# Patient Record
Sex: Male | Born: 1965 | Race: White | Hispanic: No | Smoking: Current every day smoker
Health system: Southern US, Community
[De-identification: ages and names within clinical notes are randomized; demographics above are authoritative.]

---

## 2008-07-30 ENCOUNTER — Encounter: Admission: RE | Admit: 2008-07-30 | Discharge: 2008-07-30 | Payer: Self-pay | Admitting: Family Medicine

## 2009-09-18 ENCOUNTER — Encounter: Admission: RE | Admit: 2009-09-18 | Discharge: 2009-09-18 | Payer: Self-pay | Admitting: Emergency Medicine

## 2011-01-18 ENCOUNTER — Other Ambulatory Visit: Payer: Self-pay | Admitting: Emergency Medicine

## 2011-01-18 ENCOUNTER — Ambulatory Visit
Admission: RE | Admit: 2011-01-18 | Discharge: 2011-01-18 | Disposition: A | Discharge status: Home / Self Care | Payer: Self-pay | Source: Ambulatory Visit | Attending: Emergency Medicine | Admitting: Emergency Medicine

## 2011-01-18 DIAGNOSIS — Z0000 Encounter for general adult medical examination without abnormal findings: Secondary | ICD-10-CM

## 2013-01-16 ENCOUNTER — Ambulatory Visit (INDEPENDENT_AMBULATORY_CARE_PROVIDER_SITE_OTHER): Payer: Self-pay

## 2013-01-16 ENCOUNTER — Other Ambulatory Visit: Payer: Self-pay | Admitting: Family Medicine

## 2013-01-16 DIAGNOSIS — F17200 Nicotine dependence, unspecified, uncomplicated: Secondary | ICD-10-CM

## 2013-01-16 DIAGNOSIS — Z0289 Encounter for other administrative examinations: Secondary | ICD-10-CM

## 2015-06-12 ENCOUNTER — Emergency Department (INDEPENDENT_AMBULATORY_CARE_PROVIDER_SITE_OTHER)
Admission: EM | Admit: 2015-06-12 | Discharge: 2015-06-12 | Disposition: A | Discharge status: Home / Self Care | Payer: Self-pay | Source: Home / Self Care | Attending: Family Medicine | Admitting: Family Medicine

## 2015-06-12 ENCOUNTER — Encounter: Payer: Self-pay | Admitting: *Deleted

## 2015-06-12 ENCOUNTER — Emergency Department (INDEPENDENT_AMBULATORY_CARE_PROVIDER_SITE_OTHER): Payer: Worker's Compensation

## 2015-06-12 DIAGNOSIS — M25531 Pain in right wrist: Secondary | ICD-10-CM | POA: Diagnosis not present

## 2015-06-12 DIAGNOSIS — S66911A Strain of unspecified muscle, fascia and tendon at wrist and hand level, right hand, initial encounter: Secondary | ICD-10-CM

## 2015-06-12 MED ORDER — NAPROXEN 500 MG PO TABS
500.0000 mg | ORAL_TABLET | Freq: Two times a day (BID) | ORAL | Status: DC
Start: 2015-06-12 — End: 2021-08-26
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
## 2015-06-12 NOTE — Discharge Instructions (Signed)
Thank you for coming in today. Take it easy.  Return in 1-2 weeks with me in Sports Medicine for the "all clear" Your workers comp insurance may require you to see someone else.   Wrist Sprain With Rehab A sprain is an injury in which a ligament that maintains the proper alignment of a joint is partially or completely torn. The ligaments of the wrist are susceptible to sprains. Sprains are classified into three categories. Grade 1 sprains cause pain, but the tendon is not lengthened. Grade 2 sprains include a lengthened ligament because the ligament is stretched or partially ruptured. With grade 2 sprains there is still function, although the function may be diminished. Grade 3 sprains are characterized by a complete tear of the tendon or muscle, and function is usually impaired. SYMPTOMS   Pain tenderness, inflammation, and/or bruising (contusion) of the injury.  A "pop" or tear felt and/or heard at the time of injury.  Decreased wrist function. CAUSES  A wrist sprain occurs when a force is placed on one or more ligaments that is greater than it/they can withstand. Common mechanisms of injury include:  Catching a ball with your hands.  Repetitive and/ or strenuous extension or flexion of the wrist. RISK INCREASES WITH:  Previous wrist injury.  Contact sports (boxing or wrestling).  Activities in which falling is common.  Poor strength and flexibility.  Improperly fitted or padded protective equipment. PREVENTION  Warm up and stretch properly before activity.  Allow for adequate recovery between workouts.  Maintain physical fitness:  Strength, flexibility, and endurance.  Cardiovascular fitness.  Protect the wrist joint by limiting its motion with the use of taping, braces, or splints.  Protect the wrist after injury for 6 to 12 months. PROGNOSIS  The prognosis for wrist sprains depends on the degree of injury. Grade 1 sprains require 2 to 6 weeks of treatment. Grade 2  sprains require 6 to 8 weeks of treatment, and grade 3 sprains require up to 12 weeks.  RELATED COMPLICATIONS   Prolonged healing time, if improperly treated or re-injured.  Recurrent symptoms that result in a chronic problem.  Injury to nearby structures (bone, cartilage, nerves, or tendons).  Arthritis of the wrist.  Inability to compete in athletics at a high level.  Wrist stiffness or weakness.  Progression to a complete rupture of the ligament. TREATMENT  Treatment initially involves resting from any activities that aggravate the symptoms, and the use of ice and medications to help reduce pain and inflammation. Your caregiver may recommend immobilizing the wrist for a period of time in order to reduce stress on the ligament and allow for healing. After immobilization it is important to perform strengthening and stretching exercises to help regain strength and a full range of motion. These exercises may be completed at home or with a therapist. Surgery is not usually required for wrist sprains, unless the ligament has been ruptured (grade 3 sprain). MEDICATION   If pain medication is necessary, then nonsteroidal anti-inflammatory medications, such as aspirin and ibuprofen, or other minor pain relievers, such as acetaminophen, are often recommended.  Do not take pain medication for 7 days before surgery.  Prescription pain relievers may be given if deemed necessary by your caregiver. Use only as directed and only as much as you need. HEAT AND COLD  Cold treatment (icing) relieves pain and reduces inflammation. Cold treatment should be applied for 10 to 15 minutes every 2 to 3 hours for inflammation and pain and immediately after any  activity that aggravates your symptoms. Use ice packs or massage the area with a piece of ice (ice massage).  Heat treatment may be used prior to performing the stretching and strengthening activities prescribed by your caregiver, physical therapist, or  athletic trainer. Use a heat pack or soak your injury in warm water. SEEK MEDICAL CARE IF:  Treatment seems to offer no benefit, or the condition worsens.  Any medications produce adverse side effects. EXERCISES RANGE OF MOTION (ROM) AND STRETCHING EXERCISES - Wrist Sprain  These exercises may help you when beginning to rehabilitate your injury. Your symptoms may resolve with or without further involvement from your physician, physical therapist or athletic trainer. While completing these exercises, remember:   Restoring tissue flexibility helps normal motion to return to the joints. This allows healthier, less painful movement and activity.  An effective stretch should be held for at least 30 seconds.  A stretch should never be painful. You should only feel a gentle lengthening or release in the stretched tissue. RANGE OF MOTION - Wrist Flexion, Active-Assisted  Extend your right / left elbow with your fingers pointing down.*  Gently pull the back of your hand towards you until you feel a gentle stretch on the top of your forearm.  Hold this position for __________ seconds. Repeat __________ times. Complete this exercise __________ times per day.  *If directed by your physician, physical therapist or athletic trainer, complete this stretch with your elbow bent rather than extended. RANGE OF MOTION - Wrist Extension, Active-Assisted  Extend your right / left elbow and turn your palm upwards.*  Gently pull your palm/fingertips back so your wrist extends and your fingers point more toward the ground.  You should feel a gentle stretch on the inside of your forearm.  Hold this position for __________ seconds. Repeat __________ times. Complete this exercise __________ times per day. *If directed by your physician, physical therapist or athletic trainer, complete this stretch with your elbow bent, rather than extended. RANGE OF MOTION - Supination, Active  Stand or sit with your elbows  at your side. Bend your right / left elbow to 90 degrees.  Turn your palm upward until you feel a gentle stretch on the inside of your forearm.  Hold this position for __________ seconds. Slowly release and return to the starting position. Repeat __________ times. Complete this stretch __________ times per day.  RANGE OF MOTION - Pronation, Active  Stand or sit with your elbows at your side. Bend your right / left elbow to 90 degrees.  Turn your palm downward until you feel a gentle stretch on the top of your forearm.  Hold this position for __________ seconds. Slowly release and return to the starting position. Repeat __________ times. Complete this stretch __________ times per day.  STRETCH - Wrist Flexion  Place the back of your right / left hand on a tabletop leaving your elbow slightly bent. Your fingers should point away from your body.  Gently press the back of your hand down onto the table by straightening your elbow. You should feel a stretch on the top of your forearm.  Hold this position for __________ seconds. Repeat __________ times. Complete this stretch __________ times per day.  STRETCH - Wrist Extension  Place your right / left fingertips on a tabletop leaving your elbow slightly bent. Your fingers should point backwards.  Gently press your fingers and palm down onto the table by straightening your elbow. You should feel a stretch on the inside of  your forearm.  Hold this position for __________ seconds. Repeat __________ times. Complete this stretch __________ times per day.  STRENGTHENING EXERCISES - Wrist Sprain These exercises may help you when beginning to rehabilitate your injury. They may resolve your symptoms with or without further involvement from your physician, physical therapist or athletic trainer. While completing these exercises, remember:   Muscles can gain both the endurance and the strength needed for everyday activities through controlled  exercises.  Complete these exercises as instructed by your physician, physical therapist or athletic trainer. Progress with the resistance and repetition exercises only as your caregiver advises. STRENGTH - Wrist Flexors  Sit with your right / left forearm palm-up and fully supported. Your elbow should be resting below the height of your shoulder. Allow your wrist to extend over the edge of the surface.  Loosely holding a __________ weight or a piece of rubber exercise band/tubing, slowly curl your hand up toward your forearm.  Hold this position for __________ seconds. Slowly lower the wrist back to the starting position in a controlled manner. Repeat __________ times. Complete this exercise __________ times per day.  STRENGTH - Wrist Extensors  Sit with your right / left forearm palm-down and fully supported. Your elbow should be resting below the height of your shoulder. Allow your wrist to extend over the edge of the surface.  Loosely holding a __________ weight or a piece of rubber exercise band/tubing, slowly curl your hand up toward your forearm.  Hold this position for __________ seconds. Slowly lower the wrist back to the starting position in a controlled manner. Repeat __________ times. Complete this exercise __________ times per day.  STRENGTH - Ulnar Deviators  Stand with a ____________________ weight in your right / left hand, or sit holding on to the rubber exercise band/tubing with your opposite arm supported.  Move your wrist so that your pinkie travels toward your forearm and your thumb moves away from your forearm.  Hold this position for __________ seconds and then slowly lower the wrist back to the starting position. Repeat __________ times. Complete this exercise __________ times per day STRENGTH - Radial Deviators  Stand with a ____________________ weight in your  right / left hand, or sit holding on to the rubber exercise band/tubing with your arm  supported.  Raise your hand upward in front of you or pull up on the rubber tubing.  Hold this position for __________ seconds and then slowly lower the wrist back to the starting position. Repeat __________ times. Complete this exercise __________ times per day. STRENGTH - Forearm Supinators  Sit with your right / left forearm supported on a table, keeping your elbow below shoulder height. Rest your hand over the edge, palm down.  Gently grip a hammer or a soup ladle.  Without moving your elbow, slowly turn your palm and hand upward to a "thumbs-up" position.  Hold this position for __________ seconds. Slowly return to the starting position. Repeat __________ times. Complete this exercise __________ times per day.  STRENGTH - Forearm Pronators  Sit with your right / left forearm supported on a table, keeping your elbow below shoulder height. Rest your hand over the edge, palm up.  Gently grip a hammer or a soup ladle.  Without moving your elbow, slowly turn your palm and hand upward to a "thumbs-up" position.  Hold this position for __________ seconds. Slowly return to the starting position. Repeat __________ times. Complete this exercise __________ times per day.  STRENGTH - Grip  Grasp  a tennis ball, a dense sponge, or a large, rolled sock in your hand.  Squeeze as hard as you can without increasing any pain.  Hold this position for __________ seconds. Release your grip slowly. Repeat __________ times. Complete this exercise __________ times per day.    This information is not intended to replace advice given to you by your health care provider. Make sure you discuss any questions you have with your health care provider.   Document Released: 12/27/2004 Document Revised: 09/17/2014 Document Reviewed: 04/10/2008 Elsevier Interactive Patient Education [employer]
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
## 2021-08-26 ENCOUNTER — Other Ambulatory Visit: Payer: Self-pay | Admitting: Family

## 2021-08-26 ENCOUNTER — Encounter: Payer: Self-pay | Admitting: Family

## 2021-08-26 ENCOUNTER — Ambulatory Visit (INDEPENDENT_AMBULATORY_CARE_PROVIDER_SITE_OTHER): Payer: Commercial Managed Care - PPO | Admitting: Family

## 2021-08-26 VITALS — BP 140/82 | HR 87 | Temp 97.6°F | Ht 69.0 in | Wt 213.0 lb

## 2021-08-26 DIAGNOSIS — R002 Palpitations: Secondary | ICD-10-CM | POA: Diagnosis not present

## 2021-08-26 DIAGNOSIS — R030 Elevated blood-pressure reading, without diagnosis of hypertension: Secondary | ICD-10-CM | POA: Diagnosis not present

## 2021-08-26 NOTE — Progress Notes (Signed)
  Jose Wiggins is a 56 y.o. male with the following history as recorded in [employer]:  There are no problems to display for this patient.   No current outpatient medications on file.   No current facility-administered medications for this visit.    Allergies: Patient has no known allergies.  No past medical history on file.  No past surgical history on file.  No family history on file.  Social History   Tobacco Use   Smoking status: Every Day    Packs/day: 0.25    Types: Cigarettes   Smokeless tobacco: Not on file  Substance Use Topics   Alcohol use: No    Subjective:   Patient presents today as a new patient; had DOT CPE recently and his blood pressure was noted to be elevated- 160/91; 163/100; 148/100; per patient, no prior history of hypertension; + smoker; admits that salt intake was high- working on making lifestyle changes; has switched to Mediterranean Diet and lost 8 pounds int he past month; He does mention that occasionally he feels his heart skip a beat but denies any chest pain, shortness of breath, blurred vision or headache.    Per patient, he has a yearly CPE through his employer; he is not interested in establishing care here and was sent here as a follow up to his DOT CPE. He does not want to talk about updating any labs or discuss preventive care needs today.     Objective:  Vitals:   08/26/21 0950 08/26/21 1018 08/26/21 1032  BP: (!) 150/80 (!) 138/90 (!) 140/82  Pulse: 87    Temp: 97.6 F (36.4 C)    TempSrc: Oral    SpO2: 96%    Weight: 213 lb (96.6 kg)    Height: 5\' 9"  (1.753 m)      General: Well developed, well nourished, in no acute distress  Skin : Warm and dry.  Head: Normocephalic and atraumatic  Eyes: Sclera and conjunctiva clear; pupils round and reactive to light; extraocular movements intact  Ears: External normal; canals clear; tympanic membranes normal  Oropharynx: Pink, supple. No suspicious lesions  Neck: Supple without  thyromegaly, adenopathy  Lungs: Respirations unlabored; clear to auscultation bilaterally without wheeze, rales, rhonchi  CVS exam: normal rate and regular rhythm.  Neurologic: Alert and oriented; speech intact; face symmetrical; moves all extremities well; CNII-XII intact without focal deficit   Assessment:  1. Palpitation   2. Elevated blood pressure reading     Plan:  EKG shows sinus rhythm; per patient, he has lost 8 pounds in the past month and is working on salt restriction. Discussed with him that is blood pressure is borderline at this time and will need to continue to monitor. He is given a letter as requested for his DOT CPE indicating that he plans to have his blood pressure re-checked in 30 days at the [facility] that has been assigned to do his DOT CPE. He is encouraged to continue working on salt restriction/ quitting smoking.   He notes he does not plan to return for care as he is getting his yearly CPE/ preventive health care needs managed by a provider through his employer. He defers updating any labs today or being referred for colonoscopy or immunization update.   No follow-ups on file.  Orders Placed This Encounter  Procedures   EKG 12-Lead    Requested Prescriptions    No prescriptions requested or ordered in this encounter

## 2023-05-26 ENCOUNTER — Other Ambulatory Visit: Payer: Self-pay

## 2023-05-26 ENCOUNTER — Emergency Department
Admission: EM | Admit: 2023-05-26 | Discharge: 2023-05-26 | Disposition: A | Discharge status: Home / Self Care | Attending: Emergency Medicine | Admitting: Emergency Medicine

## 2023-05-26 ENCOUNTER — Encounter: Payer: Self-pay | Admitting: Medical Oncology

## 2023-05-26 DIAGNOSIS — W57XXXA Bitten or stung by nonvenomous insect and other nonvenomous arthropods, initial encounter: Secondary | ICD-10-CM | POA: Diagnosis not present

## 2023-05-26 DIAGNOSIS — S50862A Insect bite (nonvenomous) of left forearm, initial encounter: Secondary | ICD-10-CM | POA: Diagnosis present

## 2023-05-26 NOTE — ED Provider Notes (Signed)
 [facility] Provider Note    Event Date/Time   First MD Initiated Contact with Patient 05/26/23 [id number]     (approximate)   History   Rash   HPI  Jose Wiggins is a 58 y.o. male presents to the ED with complaint of a rash to his left forearm after being bit by a tick.  He was seen at [facility] ED and mentioned that he had been bit by a tick while being seen for problems with his neck.  He was placed on Flexeril, naproxen  and doxycycline.  He was concerned today when he saw the rash which was not there when he was seen in the emergency department.     Physical Exam   Triage Vital Signs: ED Triage Vitals [05/26/23 0851]  Encounter Vitals Group     BP (!) 141/99     Systolic BP Percentile      Diastolic BP Percentile      Pulse Rate 70     Resp 18     Temp 98 F (36.7 C)     Temp Source Oral     SpO2 100 %     Weight 215 lb (97.5 kg)     Height 5\' 9"  (1.753 m)     Head Circumference      Peak Flow      Pain Score 7     Pain Loc      Pain Education      Exclude from Growth Chart     Most recent vital signs: Vitals:   05/26/23 0851  BP: (!) 141/99  Pulse: 70  Resp: 18  Temp: 98 F (36.7 C)  SpO2: 100%     General: Awake, no distress.  CV:  Good peripheral perfusion.  Resp:  Normal effort.  Abd:  No distention.  Other:  Left forearm with a single individual erythematous area that measures approximately 2 cm in diameter.  No vesicles or pustules seen.  Area is circular in nature.  Patient states that this is the same arm that he pulled a tick off but not the exact same place.  No point tenderness on palpation and no surrounding cellulitis present.   ED Results / Procedures / Treatments   Labs (all labs ordered are listed, but only abnormal results are displayed) Labs Reviewed - No data to display    PROCEDURES:  Critical Care performed:   Procedures   MEDICATIONS ORDERED IN ED: Medications - No data to  display   IMPRESSION / MDM / ASSESSMENT AND PLAN / ED COURSE  I reviewed the triage vital signs and the nursing notes.   Differential diagnosis includes, but is not limited to, nonspecific rash, contact dermatitis, allergic dermatitis, tickborne illness.  58 year old male presents to the ED with a 2 cm round erythematous rash to his left forearm.  Patient was seen recently in [facility] ED where he was prescribed naproxen , Flexeril and doxycycline when he was being seen for neck discomfort.  He was reassured that the doxycycline was the correct antibiotic for any tickborne illnesses.  Patient reports that he was not sure about the antibiotic.  Patient was discharged and to follow-up with his PCP if needed.      Patient's presentation is most consistent with acute, uncomplicated illness.  FINAL CLINICAL IMPRESSION(S) / ED DIAGNOSES   Final diagnoses:  Tick bite of left forearm, initial encounter     Rx / DC Orders   ED Discharge Orders  None        Note:  This document was prepared using Dragon voice recognition software and may include unintentional dictation errors.   Stafford Eagles, PA-C 05/26/23 1118    Twilla Galea, MD 05/26/23 1539

## 2023-05-26 NOTE — ED Notes (Signed)
 See triage note  Presents with a rash to arms  Positive itchy  States he noticed a possible tick bite 2 weeks ago  Also having neck pain and headache  Did have fever initially but is currently afebrile

## 2023-05-26 NOTE — ED Triage Notes (Signed)
 Pt here with reports of rash after tick bite about 2 weeks ago. Rash is itchy.

## 2023-05-26 NOTE — Discharge Instructions (Signed)
 Continue with doxycycline until completely finished. Follow-up with your primary care provider, urgent care or return to the emergency department if any severe worsening of your symptoms.
